# Patient Record
Sex: Male | Born: 1965 | Race: White | Hispanic: No | Marital: Married | Smoking: Never smoker
Health system: Southern US, Academic
[De-identification: ages and names within clinical notes are randomized; demographics above are authoritative.]

## PROBLEM LIST (undated history)

## (undated) DIAGNOSIS — E079 Disorder of thyroid, unspecified: Secondary | ICD-10-CM

## (undated) HISTORY — PX: TONSILLECTOMY: SUR1361

## (undated) HISTORY — PX: BICEPS TENDON REPAIR: SHX566

## (undated) HISTORY — PX: HX OTHER: 2100001105

---

## 2015-04-11 ENCOUNTER — Other Ambulatory Visit (INDEPENDENT_AMBULATORY_CARE_PROVIDER_SITE_OTHER): Payer: No Typology Code available for payment source

## 2015-04-11 ENCOUNTER — Encounter (INDEPENDENT_AMBULATORY_CARE_PROVIDER_SITE_OTHER): Payer: Self-pay

## 2015-04-11 ENCOUNTER — Ambulatory Visit (INDEPENDENT_AMBULATORY_CARE_PROVIDER_SITE_OTHER): Payer: No Typology Code available for payment source

## 2015-04-11 VITALS — BP 121/78 | HR 98 | Temp 100.0°F | Resp 16 | Ht 74.53 in | Wt 310.6 lb

## 2015-04-11 DIAGNOSIS — R509 Fever, unspecified: Secondary | ICD-10-CM

## 2015-04-11 DIAGNOSIS — R059 Cough, unspecified: Secondary | ICD-10-CM

## 2015-04-11 DIAGNOSIS — Z0189 Encounter for other specified special examinations: Secondary | ICD-10-CM

## 2015-04-11 DIAGNOSIS — R05 Cough: Secondary | ICD-10-CM

## 2015-04-11 MED ORDER — ACETAMINOPHEN 325 MG TABLET
650.0000 mg | ORAL_TABLET | Freq: Once | ORAL | Status: AC
Start: 2015-04-11 — End: 2015-04-11

## 2015-04-11 MED ORDER — PROMETHAZINE-DM 6.25 MG-15 MG/5 ML ORAL SYRUP
5.00 mL | ORAL_SOLUTION | Freq: Four times a day (QID) | ORAL | Status: AC | PRN
Start: 2015-04-11 — End: ?

## 2015-04-11 MED ORDER — DOXYCYCLINE HYCLATE 100 MG CAPSULE
100.00 mg | ORAL_CAPSULE | Freq: Two times a day (BID) | ORAL | Status: AC
Start: 2015-04-11 — End: 2015-04-18

## 2015-04-11 NOTE — Progress Notes (Signed)
A. Shawnie PonsPratt, PA-C attempted to contact patient to discuss xray results, left message to return call. Please notify patient of final read and that doxycycline was e-scribed to pharmacy.  Leanord AsalNiki D Danna Casella, RN  04/11/2015, 14:47

## 2015-04-11 NOTE — Progress Notes (Signed)
Patient has given verbal permission for the student scribe to assist the healthcare provider during the patients visit.Edward ParisKayla M Karalynn Cottone, RN 04/11/2015, 12:14

## 2015-04-11 NOTE — Addendum Note (Signed)
Addended by: Hezzie BumpPRATT, Jaydian Santana B on: 04/11/2015 04:39 PM     Modules accepted: Orders

## 2015-04-11 NOTE — Patient Instructions (Signed)
Cough, Adult   A cough is a reflex that helps clear your throat and airways. It can help heal the body or may be a reaction to an irritated airway. A cough may only last 2 or 3 weeks (acute) or may last more than 8 weeks (chronic).   CAUSES  Acute cough:   Viral or bacterial infections.  Chronic cough:   Infections.   Allergies.   Asthma.   Post-nasal drip.   Smoking.   Heartburn or acid reflux.   Some medicines.   Chronic lung problems (COPD).   Cancer.  SYMPTOMS    Cough.   Fever.   Chest pain.   Increased breathing rate.   High-pitched whistling sound when breathing (wheezing).   Colored mucus that you cough up (sputum).  TREATMENT    A bacterial cough may be treated with antibiotic medicine.   A viral cough must run its course and will not respond to antibiotics.   Your caregiver may recommend other treatments if you have a chronic cough.  HOME CARE INSTRUCTIONS    Only take over-the-counter or prescription medicines for pain, discomfort, or fever as directed by your caregiver. Use cough suppressants only as directed by your caregiver.   Use a cold steam vaporizer or humidifier in your bedroom or home to help loosen secretions.   Sleep in a semi-upright position if your cough is worse at night.   Rest as needed.   Stop smoking if you smoke.  SEEK IMMEDIATE MEDICAL CARE IF:    You have pus in your sputum.   Your cough starts to worsen.   You cannot control your cough with suppressants and are losing sleep.   You begin coughing up blood.   You have difficulty breathing.   You develop pain which is getting worse or is uncontrolled with medicine.   You have a fever.  MAKE SURE YOU:    Understand these instructions.   Will watch your condition.   Will get help right away if you are not doing well or get worse.     This information is not intended to replace advice given to you by your health care provider. Make sure you discuss any questions you have with your health care provider.      Document Released: 12/05/2010 Document Revised: 08/31/2011 Document Reviewed: 08/15/2014  Elsevier Interactive Patient Education 2016 [employer].  Dextromethorphan; Promethazine oral solution  What is this medicine?  DEXTROMETHORPHAN; PROMETHAZINE (dex troe meth OR fan; proe METH a zeen) is a cough suppressant and an antihistamine. It is used to treat coughing due to colds or allergies. This medicine will not treat an infection.  This medicine may be used for other purposes; ask your health care provider or pharmacist if you have questions.  What should I tell my health care provider before I take this medicine?  They need to know if you have any of the following conditions:  -asthma or other lung disease  -diabetes  -eczema  -seizure disorder  -serious or chronic illness  -sleep apnea  -an unusual or allergic reaction to dextromethorphan, promethazine, phenothiazines, other medicines, foods, dyes, or preservatives  -pregnant or trying to get pregnant  -breast-feeding  How should I use this medicine?  Take this medicine by mouth with a glass of water. Follow the directions on the prescription label. Use a specially marked spoon or container to measure your medicine. Household spoons are not accurate. Take your doses at regular intervals. Do not take your medicine  more often than directed.  Talk to your pediatrician regarding the use of this medicine in children. Special care may be needed. Do not use this medicine in children less than 77 years of age.  Patients over 8 years old may have a stronger reaction and need a smaller dose.  Overdosage: If you think you have taken too much of this medicine contact a poison control center or emergency room at once.  NOTE: This medicine is only for you. Do not share this medicine with others.  What if I miss a dose?  If you miss a dose, take it as soon as you can. If it is almost time for your next dose, take only that dose. Do not take double or extra doses.  What may  interact with this medicine?  Do not take this medicine with any of the following medications:  -MAOIs like Carbex, Eldepryl, Marplan, Nardil, and Parnate  This medicine may also interact with the following medications:  -alcohol or alcohol-containing products  -barbiturate medicines like phenobarbital  -epinephrine  -medicines for depression, anxiety or psychotic disturbances  -medicines for Parkinson's disease  -medicines for sleep  -medicines for the stomach like metoclopramide, dicyclomine, glycopyrrolate  -pain medicines  -radio contrast dyes  -sibutramine  -some medicines for cold or allergies  This list may not describe all possible interactions. Give your health care provider a list of all the medicines, herbs, non-prescription drugs, or dietary supplements you use. Also tell them if you smoke, drink alcohol, or use illegal drugs. Some items may interact with your medicine.  What should I watch for while using this medicine?  Tell your doctor if your symptoms do not improve or if they get worse.  You may get drowsy or dizzy. Do not drive, use machinery, or do anything that needs mental alertness until you know how this medicine affects you. Do not stand or sit up quickly, especially if you are an older patient. This reduces the risk of dizzy or fainting spells. Alcohol may interfere with the effect of this medicine. Avoid alcoholic drinks.  This medicine can make you more sensitive to the sun. Keep out of the sun. If you cannot avoid being in the sun, wear protective clothing and use sunscreen. Do not use sun lamps or tanning beds/booths.  What side effects may I notice from receiving this medicine?  Side effects that you should report to your doctor or health care professional as soon as possible:  -allergic reactions like skin rash, itching or hives, swelling of the face, lips, or tongue  -breathing problems  -changes in vision  -confused, disoriented, excitable  -fast, irregular heartbeat  -fever,  sweating  -hallucinations  -high or low blood pressure  -lightheaded  -muscle stiffness  -seizures  -tremors, twitches  -yellow eyes or skin  Side effects that usually do not require medical attention (report to your doctor or health care professional if they continue or are bothersome):  -congestion in the nose  -dry mouth  -nausea, vomiting  -stomach upset  -trouble sleeping  This list may not describe all possible side effects. Call your doctor for medical advice about side effects. You may report side effects to FDA at 1-800-FDA-1088.  Where should I keep my medicine?  Keep out of the reach of children.  Store at room temperature between 20 and 25 degrees C (68 and 77 degrees F). Protect from light. Throw away any unused medicine after the expiration date.  NOTE: This sheet is a summary.  It may not cover all possible information. If you have questions about this medicine, talk to your doctor, pharmacist, or health care provider.      2016, Elsevier/Gold Standard. (2007-09-26 17:01:49)     [facility]      Operated by [facility]  [address] [place]  [place], [place] [address]  Phone: [phone]  Fax: [phone]  [email or web address]  Open Daily 8:00a - 8:00p    Closed Thanksgiving and Christmas Day  [facility]     Operated by [facility]  [address]  [facility]  [place], [place] [address]  Phone: [phone]  Fax: [phone]  [email or web address]  Open M-F  8:00a - 8:00p  Sat              10:00a - 4:00p  Sun             Closed  Closed all [place] Holidays        Attending Caregiver: Hassell DoneAdam B Calleigh Lafontant, PA-C      Today's orders:   Orders Placed This Encounter    CXR PA & Lateral    Influenza A & B    acetaminophen (TYLENOL) 325 mg Oral Tablet    promethazine-dextromethorphan (PHENERGAN-DM) 6.25-15 mg/5 mL Oral Syrup        Prescription(s) E-Rx to:  [id number] - [place], [place] - [address]    ________________________________________________________________________  Short Term Disability and Family Medical Leave Act  [facility] does NOT provide assistance with any disability applications.  If you feel your medical condition requires you to be on disability, you will need to follow up with  your primary care physician or a specialist.  We apologize for any inconvenience.    For Medication Prescribed by [facility]:  As an Urgent Care facility, our clinic does NOT offer prescription refills over the telephone.    If you need more of the medication one of our medical providers prescribed, you will  either need to be re-evaluated by us or see your primary care physician.    ________________________________________________________________________      It is very important that we have a phone number.  This is the single best way to contact you in the event that we become aware of important clinical information or concerns after your discharge.  If the phone number you provided at registration is NOT this number you should inform staff and registration prior to leaving.      Your treatment and evaluation today was focused on identifying and treating potentially emergent conditions based on your presenting signs, symptoms, and history.  The resulting initial clinical impression and treatment plan is not intended to be definitive or a substitute for a full physical examination and evaluation by your primary care provider.  If your symptoms persist, worsen, or you develop any new or concerning symptoms, you need to be evaluated.      If you received x-rays during your visit, be aware that the final and formal interpretation of those films by a radiologist may occur after your discharge.  If there is a significant discrepancy identified after your discharge, we will contact you at the telephone number provided at registration.      If you received a pelvic exam, you may have cultures  pending for sexually transmitted diseases.  Positive cultures are reported to the [facility] Department of Health as required by state law.  You may contact the  Health Information Management Office of [facility] to get a copy of your results.     If you are over 17 year old, we cannot discuss your personal health information with a parent, spouse, family member, or anyone else without your consent.  This does not include those who have legitimate access to your records and information to assist in your care under the provisions of HIPAA [facility] Portability and Accountability Act) law, or those to whom you have previously given written consent to do so, such a legal guardian or Power of [place].      Instructions are discussed with patient upon discharge by clinical staff with all questions answered.  Please call [facility] [phone] if any further questions develop.  Go immediately to the emergency department if any concerns or worsening symptoms.      Hassell Done, PA-C 04/11/2015, 13:17

## 2015-04-11 NOTE — Progress Notes (Addendum)
Attending Dr. Trudie Reed     History of Present Illness: Edward Friedman is a 49 y.o. male who presents to the Urgent Care today with chief complaint of    Chief Complaint     Fever At night    Cough     Headache     Back Pain           .   Pt presents with flu like symptoms that began 4 days ago. Pt has fever, productive cough, myalgias, and mild HA. Pt sts he only has a sore throat if he coughs a lot. Pt has tried Advil and Ibuprofen with some relief. Pt's wife was diagnosed with Pneumonia and his grand kids alsop have similar symptoms. Pt sts he has had his flu shot. Works as a Charity fundraiser at [employer].    Location: general   Quality: flu like   Onset: 4 days   Severity: moderate   Timing: constant   Context: Pt's wife was diagnosed with Pneumonia and his grand kids alsop have similar symptoms.   Modifying factors: Pt has tried Advil and Ibuprofen with some relief.   Associated symptoms: myalgias, cough,  Fever, HA  Denials: sore throat, nausea, vomiting     I reviewed and confirmed the patient's past medical history taken by the nurse or medical assistant with the addition of the following:    Past Medical History:    History reviewed. No pertinent past medical history.      Past Surgical History:    Past Surgical History   Procedure Laterality Date    Hx other       For sleep apnea         Allergies:  No Known Allergies  Medications:    Current Outpatient Prescriptions   Medication Sig    acetaminophen (TYLENOL) 325 mg Oral Tablet Take 2 Tabs (650 mg total) by mouth One time for 1 dose     Social History:    Social History   Substance Use Topics    Smoking status: Never Smoker     Smokeless tobacco: None    Alcohol Use: No     Family History: No significant family history.  Family History   Problem Relation Age of Onset    Hypertension Mother     Alzheimer's/Dementia Mother          Review of Systems:    General: fever and malgias  ENT:  no sore throat  Pulmonary:   productive cough  Gastrointestinal:  no nausea and no  vomiting  All other review of systems were negative    Physical Exam:  Vital signs:   Filed Vitals:    04/11/15 1212   BP: 121/78   Pulse: 109   Temp: 37.8 C (100 F)   TempSrc: Tympanic   Resp: 16   Height: 1.893 m (6' 2.53")   Weight: 140.9 kg (310 lb 10.1 oz)   SpO2: 95%     Body mass index is 39.32 kg/(m^2). Normalized weight-for-age data available only for age 55 to 20 years.  No LMP for male patient.    General:  Well appearing and No acute distress  Head:  Normocephalic  Eyes:  Normal lids/lashes, PERRL, EOMI and normal conjunctiva  ENT:  normal EAC's, normal TM's, MMM, normal pharynx/tonsils, no peritonsillar mass and normal tongue/uvula  Pulmonary:  clear to auscultation bilaterally, no rales, no rhonchi and scant wheezes on the L   Cardiovascular:  regular rate/rhythm, normal S1/S2 and no  murmur/rub/gallop  Skin:  warm/dry and no rash  Psychiatric:  Appropriate affect and behavior  Neurologic:   Alert and oriented x 3. No nuchal rigidity or meningeal signs.    Data Reviewed:      Point-of-care testing:     Rapid Influenza A: Negative  Rapid Influenza B: Negative                  Radiography: As Read by Dr. Trudie ReedBurrell: no focal consolidation     Course: Condition at discharge: Good     Differential Diagnosis: Influenza vs Pneumonia vs URI     Assessment:   1. Fever, unspecified fever cause        Plan:    Orders Placed This Encounter    CXR PA & Lateral    Influenza A & B    acetaminophen (TYLENOL) 325 mg Oral Tablet         Pt educated on supportive therapy. Will continue to monitor sx's and return to clinic or go to the ER if worse. No indication for abx at this time.    Go to Emergency Department immediately for further work up if any concerning symptoms.  Plan was discussed and patient verbalized understanding.  If symptoms are worsening or not improving the patient should return to the Urgent Care for further evaluation.    I am scribing for, and in the presence of, Hezzie Bumpdam Makynli Stills, PA-C for services  provided on 04/11/2015.  [address]livia Pendergrass, SCRIBE     [place]onse[place]livia Pendergrass, SCRIBE 04/11/2015, 13:10      CXR read as concern for LLL consolidation. Will contact pt and Rx doxy Hassell Donedam B Renessa Wellnitz, PA-C  04/11/2015, 14:22      I personally performed the services described in this documentation, as scribed  in my presence, and it is both accurate  and complete.    Hassell DoneAdam B Ronnica Dreese, PA-C  The co-signing faculty was physically present in ED/Urgent Care/Student Health and available for consultation and did not particpate in the care of this patient.@  Hassell DoneAdam B Tommaso Cavitt, PA-C  04/12/2015, 10:50

## 2015-04-12 ENCOUNTER — Telehealth (INDEPENDENT_AMBULATORY_CARE_PROVIDER_SITE_OTHER): Payer: Self-pay

## 2015-04-12 NOTE — Telephone Encounter (Signed)
Patient contact clinic about message from yesterday. Informed patient per A. Shawnie Ponsratt that final ready of xray showed pneumonia and we sent in an antibiotic to the pharmacy. Informed patient that he should finish the course of antibiotics and follow up with PCP or UC after completion. Binnie KandJessica Anderson, RTR  04/12/2015, 11:44

## 2016-01-29 ENCOUNTER — Emergency Department (HOSPITAL_COMMUNITY): Payer: No Typology Code available for payment source

## 2016-01-29 ENCOUNTER — Encounter (HOSPITAL_COMMUNITY): Payer: Self-pay | Admitting: *Deleted

## 2016-01-29 ENCOUNTER — Emergency Department (HOSPITAL_COMMUNITY)
Admission: EM | Admit: 2016-01-29 | Discharge: 2016-01-30 | Disposition: A | Discharge status: Home / Self Care | Payer: No Typology Code available for payment source | Attending: Emergency Medicine | Admitting: Emergency Medicine

## 2016-01-29 DIAGNOSIS — R071 Chest pain on breathing: Secondary | ICD-10-CM | POA: Diagnosis present

## 2016-01-29 DIAGNOSIS — R079 Chest pain, unspecified: Secondary | ICD-10-CM | POA: Insufficient documentation

## 2016-01-29 DIAGNOSIS — R002 Palpitations: Secondary | ICD-10-CM | POA: Insufficient documentation

## 2016-01-29 HISTORY — DX: Disorder of thyroid, unspecified: E07.9

## 2016-01-29 LAB — BASIC METABOLIC PANEL
Anion gap: 8 (ref 5–15)
BUN: 11 mg/dL (ref 6–20)
CALCIUM: 9.2 mg/dL (ref 8.9–10.3)
CO2: 25 mmol/L (ref 22–32)
CREATININE: 1 mg/dL (ref 0.61–1.24)
Chloride: 104 mmol/L (ref 101–111)
GFR calc Af Amer: >60 mL/min (ref >60)
GLUCOSE: 93 mg/dL (ref 65–99)
Potassium: 4.3 mmol/L (ref 3.5–5.1)
Sodium: 137 mmol/L (ref 135–145)

## 2016-01-29 LAB — CBC
HCT: 49.4 % (ref 39.0–52.0)
Hemoglobin: 16.7 g/dL (ref 13.0–17.0)
MCH: 30.6 pg (ref 26.0–34.0)
MCHC: 33.8 g/dL (ref 30.0–36.0)
MCV: 90.5 fL (ref 78.0–100.0)
Platelets: 307 10*3/uL (ref 150–400)
RBC: 5.46 MIL/uL (ref 4.22–5.81)
RDW: 12.4 % (ref 11.5–15.5)
WBC: 9.9 10*3/uL (ref 4.0–10.5)

## 2016-01-29 LAB — I-STAT TROPONIN, ED: TROPONIN I, POC: 0 ng/mL (ref 0.00–0.08)

## 2016-01-29 NOTE — ED Notes (Signed)
Name was called but no answer

## 2016-01-29 NOTE — ED Triage Notes (Signed)
Pt c/o a fluttering feeling in his left chest intermittently x 1 week. States at times it feels like a flutter and at times it feels like his heart is beating heavy. Denies hx of afib. States he has never had cardiac issues before. Also c/o some weakness and lightheadedness with shortness of breath when he feels the fluttering.

## 2016-01-30 LAB — I-STAT TROPONIN, ED: Troponin i, poc: 0.01 ng/mL (ref 0.00–0.08)

## 2016-01-30 NOTE — ED Provider Notes (Signed)
[facility]-EMERGENCY DEPT Provider Note   CSN: [id number] Arrival date & time: 01/29/16  1856  By signing my name below, I, Phillis HaggisGabriella Gaje, attest that this documentation has been prepared under the direction and in the presence of Zadie Rhineonald Romaine Neville, MD. Electronically Signed: Phillis HaggisGabriella Gaje, ED Scribe. 01/30/16. 12:37 AM.  First Provider Contact:  None    History   Chief Complaint Chief Complaint  Patient presents with  . Chest Pain    The history is provided by the patient. No language interpreter was used.  Chest Pain   This is a new problem. The current episode started more than 1 week ago. The problem occurs constantly. The problem has been gradually worsening. Pain location: left chest. The pain is moderate. The quality of the pain is described as sharp (fluttering). The pain radiates to the left neck and left shoulder. The symptoms are aggravated by deep breathing. Associated symptoms include dizziness, leg pain, palpitations and shortness of breath. Pertinent negatives include no abdominal pain, no diaphoresis, no fever, no hemoptysis, no lower extremity edema, no syncope and no vomiting. He has tried nothing for the symptoms. Risk factors include obesity.  Pertinent negatives for past medical history include no CAD, no CHF, no DVT, no MI and no PE.  HPI Comments: James Horne is a 50 y.o. male who presents to the Emergency Department complaining of gradually worsening, sharp, left sided chest pain that radiates to the left shoulder and neck onset one week ago. Pt reports associated dizziness, SOB, and right leg pain that began 3 weeks ago. Pt states that it feels like his heart is fluttering. He states that it did not cause him pain, but he was aware of the sensation. He reports, "It didn't feel like my heart rate was speeding up, it just felt irregular."  He states that the SOB worsens when the "fluttering" is occurring and has worsening pain with deep breathing. He has not taken anything  for his symptoms. He denies hx of similar symptoms. He denies hx of heart disease, hx of blood clots, is not on regular medications, recent long distance travel , recent surgery, abdominal pain, diaphoresis, fever, hemoptysis, leg swelling, syncope, or vomiting.    FAM HX - POSITIVE FOR CAD Past Medical History:  Diagnosis Date  . Thyroid disease     There are no active problems to display for this patient.   Past Surgical History:  Procedure Laterality Date  . BICEPS TENDON REPAIR    . TONSILLECTOMY       Home Medications    Prior to Admission medications   Not on File    Family History No family history on file.  Social History Social History  Substance Use Topics  . Smoking status: Never Smoker  . Smokeless tobacco: Never Used  . Alcohol use No     Allergies   Review of patient's allergies indicates no known allergies.   Review of Systems Review of Systems  Constitutional: Negative for diaphoresis and fever.  Respiratory: Positive for shortness of breath. Negative for hemoptysis.   Cardiovascular: Positive for chest pain and palpitations. Negative for syncope.  Gastrointestinal: Negative for abdominal pain and vomiting.  Neurological: Positive for dizziness.  All other systems reviewed and are negative.  Physical Exam Updated Vital Signs BP 103/75 (BP Location: Left Arm)   Pulse 75   Temp 98.4 F (36.9 C) (Oral)   Resp (!) 8   Ht 6\' 3"  (1.905 m)   Wt (!) 312 lb (141.5 kg)  SpO2 95%   BMI 39.00 kg/m   Physical Exam CONSTITUTIONAL: Well developed/well nourished HEAD: Normocephalic/atraumatic EYES: EOMI/PERRL ENMT: Mucous membranes moist NECK: supple no meningeal signs SPINE/BACK:entire spine nontender CV: S1/S2 noted, no murmurs/rubs/gallops noted LUNGS: Lungs are clear to auscultation bilaterally, no apparent distress ABDOMEN: soft, nontender, no rebound or guarding, bowel sounds noted throughout abdomen GU:no cva tenderness NEURO: Pt is  awake/alert/appropriate, moves all extremitiesx4.  No facial droop.   EXTREMITIES: pulses normal/equal, full ROM, no calf tenderness or edema SKIN: warm, color normal PSYCH: no abnormalities of mood noted, alert and oriented to situation  ED Treatments / Results  DIAGNOSTIC STUDIES: Oxygen Saturation is 95% on RA, adequate by my interpretation.    COORDINATION OF CARE: 12:34 AM-Discussed treatment plan which includes labs, EKG, and chest x-ray with pt at bedside and pt agreed to plan.    Labs (all labs ordered are listed, but only abnormal results are displayed) Labs Reviewed  BASIC METABOLIC PANEL  CBC  I-STAT TROPOININ, ED  I-STAT TROPOININ, ED    EKG  EKG Interpretation  Date/Time:  Wednesday January 29 2016 19:11:03 EDT Ventricular Rate:  93 PR Interval:  160 QRS Duration: 90 QT Interval:  348 QTC Calculation: 432 R Axis:   23 Text Interpretation:  Normal sinus rhythm Normal ECG Confirmed by Bebe Shaggy  MD, Dorinda Hill ([id number]) on 01/30/2016 12:23:10 AM       Radiology Dg Chest 2 View  Result Date: 01/29/2016 CLINICAL DATA:  heart starts fluttering and when it slows down it feels like it's pounding and has been experiencing it for about a week. Feels discomfort in his left shoulder and up into his neck and left jaw, and it feels "different" when swallowing. EXAM: CHEST - 2 VIEW COMPARISON:  none FINDINGS: Lungs are clear. Heart size and mediastinal contours are within normal limits. No effusion. Visualized bones unremarkable. IMPRESSION: No acute cardiopulmonary disease. Electronically Signed   By: Corlis Leak M.D.   On: 01/29/2016 20:21    Procedures Procedures (including critical care time)  Medications Ordered in ED Medications - No data to display   Initial Impression / Assessment and Plan / ED Course  I have reviewed the triage vital signs and the nursing notes.  Pertinent labs & imaging results that were available during my care of the patient were reviewed by me  and considered in my medical decision making (see chart for details).  Clinical Course    HEART score 2 with 2 negative troponins He appears PERC negative Will need f/u for palpitations He is living locally for next month Referral to cardiology given to patient   Final Clinical Impressions(s) / ED Diagnoses   Final diagnoses:  Chest pain, unspecified chest pain type  Palpitations  I personally performed the services described in this documentation, which was scribed in my presence. The recorded information has been reviewed and is accurate.      New Prescriptions New Prescriptions   No medications on file     Zadie Rhine, MD 01/30/16 [phone]

## 2016-01-30 NOTE — ED Notes (Signed)
Dr. Bebe ShaggyWickline at bedside with patient/family.

## 2017-10-17 IMAGING — CR DG CHEST 2V
2 series · 2 of 2 positions shown · non-contrast
Comparison: none

CLINICAL DATA: heart starts fluttering and when it slows down it
feels like it's pounding and has been experiencing it for about a
week. Feels discomfort in his left shoulder and up into his neck and
left jaw, and it feels "different" when swallowing.

EXAM:
CHEST - 2 VIEW

[chest pa]
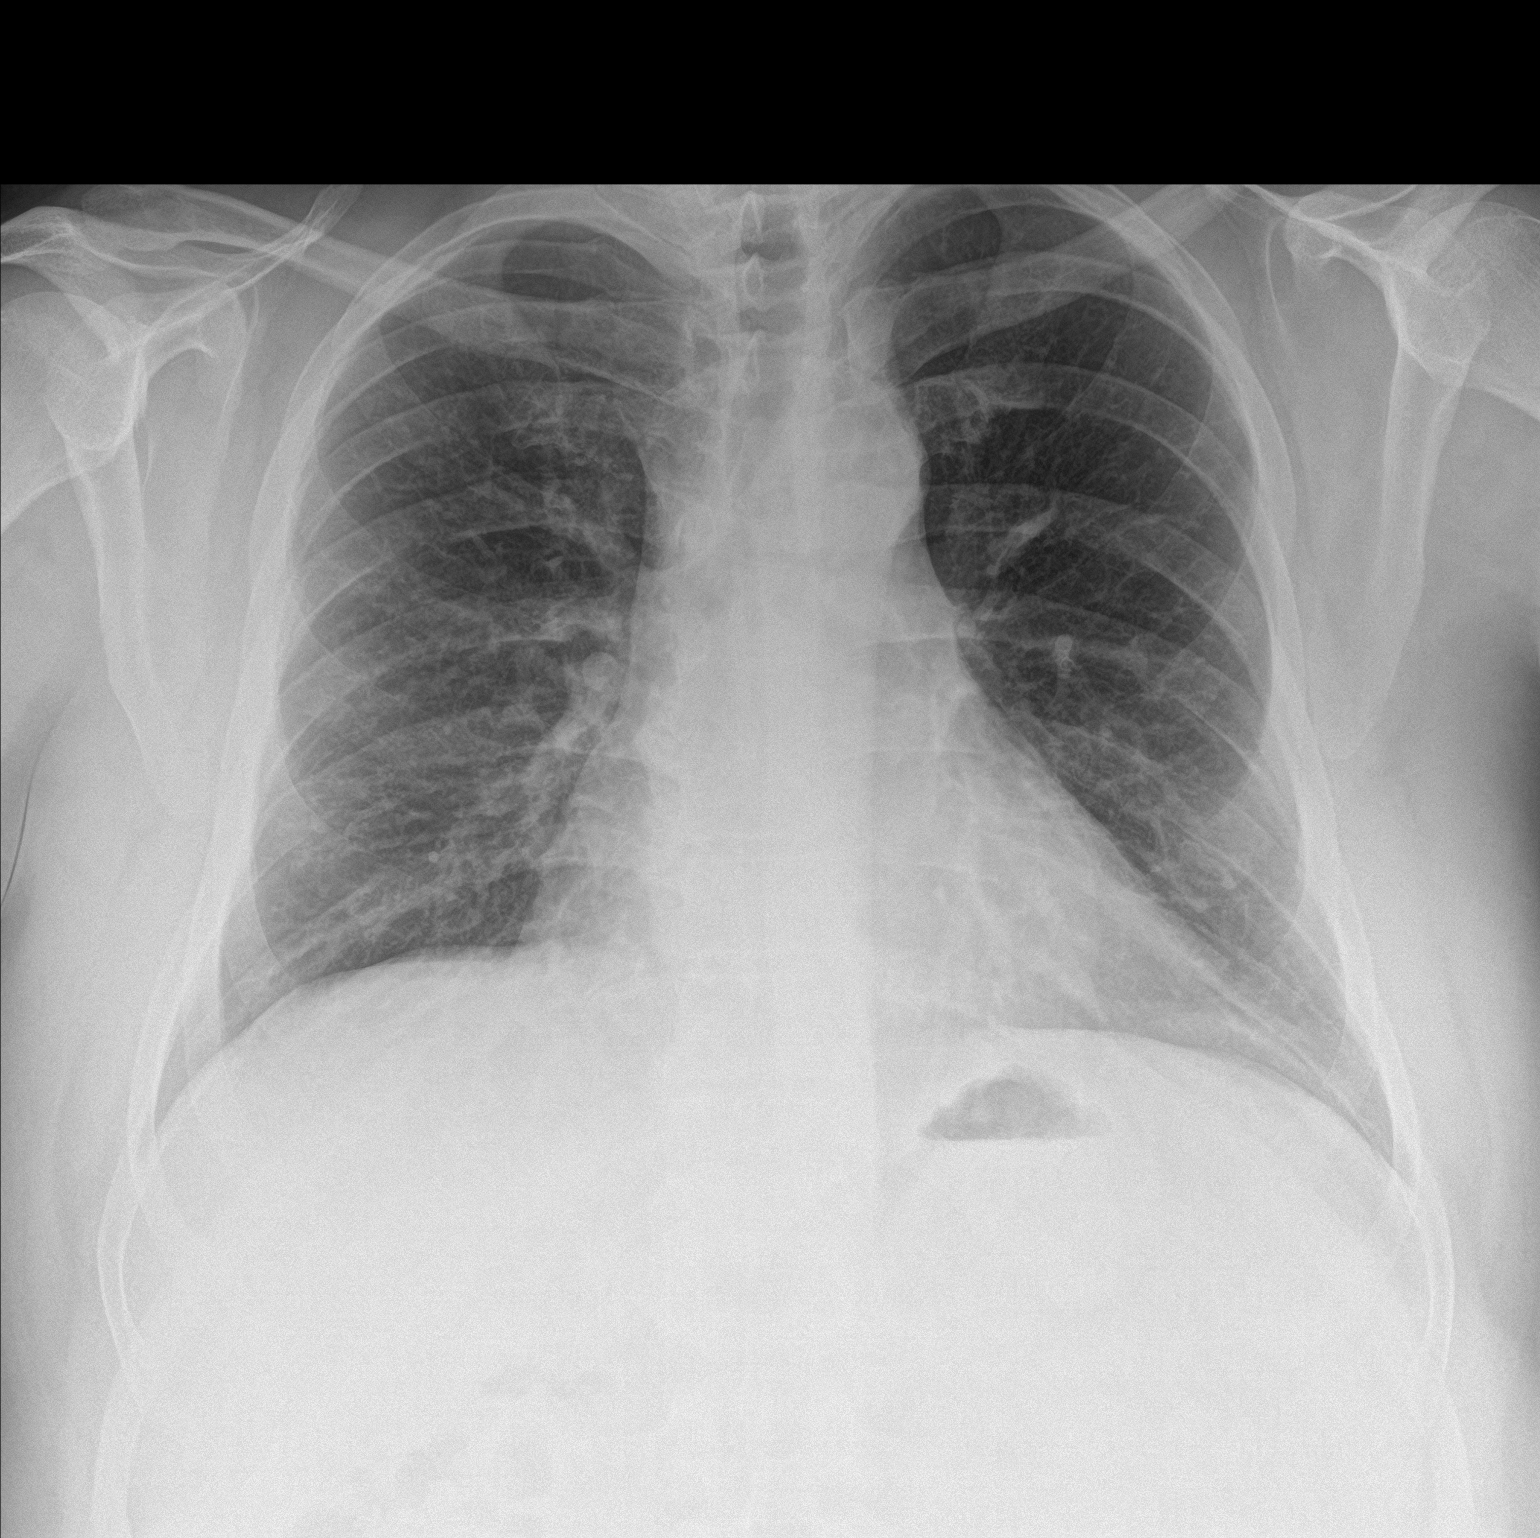

[chest lat]
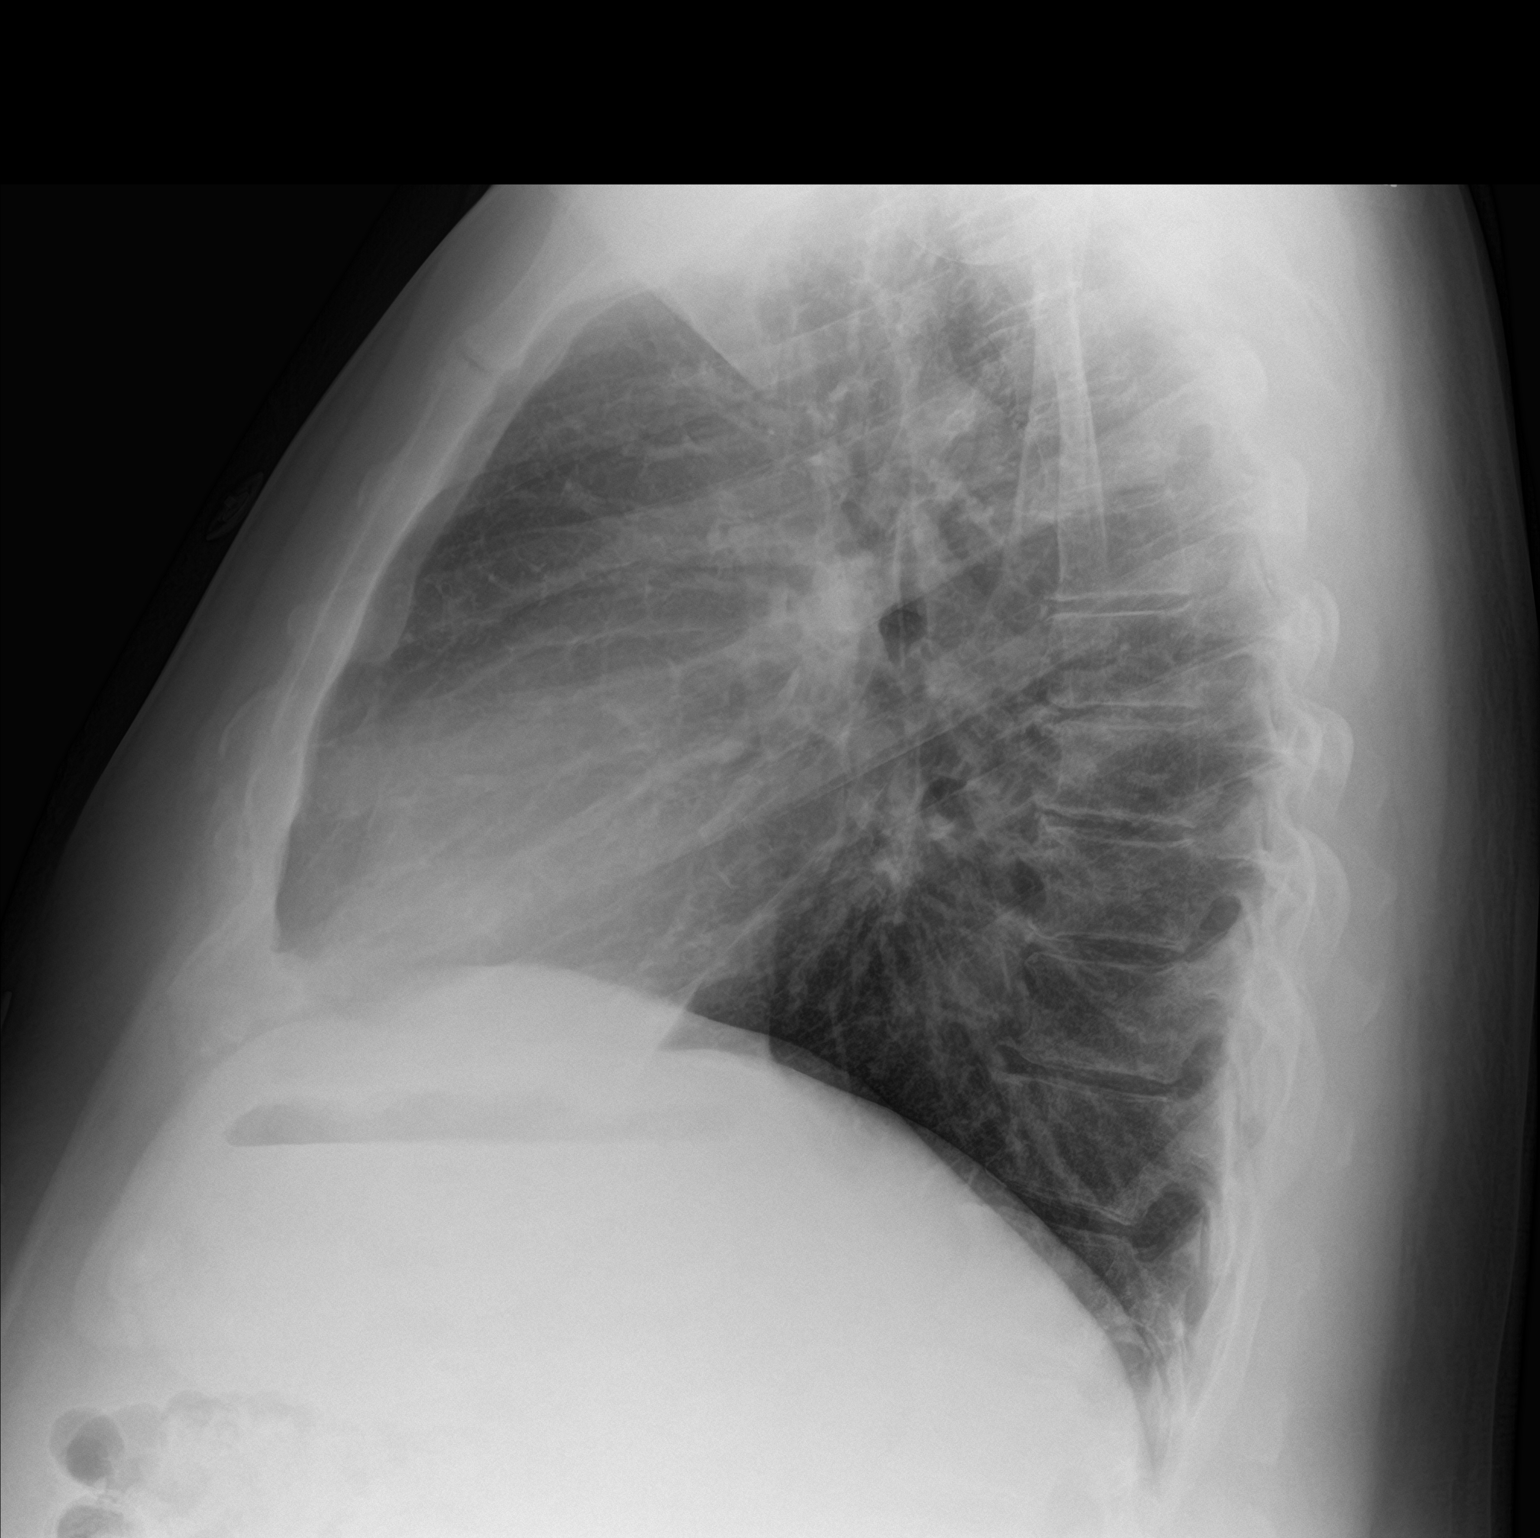

[2 of 2 positions shown; findings below may reference images not displayed]

FINDINGS: Lungs are clear.

Heart size and mediastinal contours are within normal limits.

No effusion.

Visualized bones unremarkable.
IMPRESSION: No acute cardiopulmonary disease.
